# Patient Record
Sex: Male | Born: 1950 | Race: White | Hispanic: No | Marital: Married | Smoking: Never smoker
Health system: Southern US, Community
[De-identification: ages and names within clinical notes are randomized; demographics above are authoritative.]

## PROBLEM LIST (undated history)

## (undated) DIAGNOSIS — F32A Depression, unspecified: Secondary | ICD-10-CM

## (undated) DIAGNOSIS — N400 Enlarged prostate without lower urinary tract symptoms: Secondary | ICD-10-CM

## (undated) DIAGNOSIS — K219 Gastro-esophageal reflux disease without esophagitis: Secondary | ICD-10-CM

## (undated) DIAGNOSIS — K589 Irritable bowel syndrome without diarrhea: Secondary | ICD-10-CM

## (undated) DIAGNOSIS — G43909 Migraine, unspecified, not intractable, without status migrainosus: Secondary | ICD-10-CM

## (undated) DIAGNOSIS — F329 Major depressive disorder, single episode, unspecified: Secondary | ICD-10-CM

## (undated) HISTORY — PX: SEPTOPLASTY: SUR1290

## (undated) HISTORY — PX: TOE SURGERY: SHX1073

## (undated) HISTORY — PX: SHOULDER SURGERY: SHX246

## (undated) HISTORY — PX: APPENDECTOMY: SHX54

---

## 2000-06-02 ENCOUNTER — Ambulatory Visit (HOSPITAL_COMMUNITY): Admission: RE | Admit: 2000-06-02 | Discharge: 2000-06-02 | Payer: Self-pay | Admitting: Gastroenterology

## 2006-05-23 ENCOUNTER — Ambulatory Visit (HOSPITAL_BASED_OUTPATIENT_CLINIC_OR_DEPARTMENT_OTHER): Admission: RE | Admit: 2006-05-23 | Discharge: 2006-05-23 | Payer: Self-pay | Admitting: Orthopaedic Surgery

## 2017-08-19 ENCOUNTER — Encounter: Payer: Self-pay | Admitting: Emergency Medicine

## 2017-08-19 ENCOUNTER — Other Ambulatory Visit: Payer: Self-pay

## 2017-08-19 ENCOUNTER — Emergency Department (INDEPENDENT_AMBULATORY_CARE_PROVIDER_SITE_OTHER)
Admission: EM | Admit: 2017-08-19 | Discharge: 2017-08-19 | Disposition: A | Discharge status: Home / Self Care | Payer: BLUE CROSS/BLUE SHIELD | Source: Home / Self Care | Attending: Family Medicine | Admitting: Family Medicine

## 2017-08-19 ENCOUNTER — Emergency Department (INDEPENDENT_AMBULATORY_CARE_PROVIDER_SITE_OTHER): Payer: BLUE CROSS/BLUE SHIELD

## 2017-08-19 DIAGNOSIS — S61217A Laceration without foreign body of left little finger without damage to nail, initial encounter: Secondary | ICD-10-CM | POA: Diagnosis not present

## 2017-08-19 DIAGNOSIS — W228XXA Striking against or struck by other objects, initial encounter: Secondary | ICD-10-CM

## 2017-08-19 DIAGNOSIS — S6992XA Unspecified injury of left wrist, hand and finger(s), initial encounter: Secondary | ICD-10-CM | POA: Diagnosis not present

## 2017-08-19 DIAGNOSIS — Z23 Encounter for immunization: Secondary | ICD-10-CM | POA: Diagnosis not present

## 2017-08-19 HISTORY — DX: Migraine, unspecified, not intractable, without status migrainosus: G43.909

## 2017-08-19 HISTORY — DX: Benign prostatic hyperplasia without lower urinary tract symptoms: N40.0

## 2017-08-19 HISTORY — DX: Depression, unspecified: F32.A

## 2017-08-19 HISTORY — DX: Gastro-esophageal reflux disease without esophagitis: K21.9

## 2017-08-19 HISTORY — DX: Major depressive disorder, single episode, unspecified: F32.9

## 2017-08-19 MED ORDER — TETANUS-DIPHTH-ACELL PERTUSSIS 5-2.5-18.5 LF-MCG/0.5 IM SUSP
0.5000 mL | Freq: Once | INTRAMUSCULAR | Status: AC
  Administered 2017-08-19: 0.5 mL via INTRAMUSCULAR

## 2017-08-19 NOTE — ED Provider Notes (Signed)
[facility]    CSN: [id number] Arrival date & time: 08/19/17  1608     History   Chief Complaint Chief Complaint  Patient presents with  . Hand Injury    HPI Dalton Silva is a 67 y.o. male.   Patient hit his left fifth fingertip with a hammer today, resulting in a laceration.  Fortunately he was wearing gloves at the time.  He does not remember his last Tdap    Laceration  Length:  1cm Depth:  Through underlying tissue Quality comment:  Flap Bleeding: controlled   Time since incident:  4 hours Laceration mechanism:  Blunt object Foreign body present:  No foreign bodies Relieved by:  Nothing Worsened by:  Movement Ineffective treatments:  None tried Tetanus status:  Out of date Associated symptoms: no numbness and no swelling     Past Medical History:  Diagnosis Date  . BPH (benign prostatic hyperplasia)   . Depression   . GERD (gastroesophageal reflux disease)   . Migraine     There are no active problems to display for this patient.   Past Surgical History:  Procedure Laterality Date  . APPENDECTOMY    . SEPTOPLASTY    . SHOULDER SURGERY Bilateral   . TOE SURGERY         Home Medications    Prior to Admission medications   Medication Sig Start Date End Date Taking? Authorizing Provider  dicyclomine (BENTYL) 10 MG capsule Take 10 mg by mouth 4 (four) times daily -  before meals and at bedtime.   Yes [provider]  pantoprazole (PROTONIX) 40 MG tablet Take 40 mg by mouth daily.   Yes [provider]  rosuvastatin (CRESTOR) 10 MG tablet Take 10 mg by mouth daily.   Yes [provider]  sertraline (ZOLOFT) 100 MG tablet Take 100 mg by mouth daily.   Yes [provider]  SUMAtriptan (IMITREX) 100 MG tablet Take 100 mg by mouth every 2 (two) hours as needed for migraine. May repeat in 2 hours if headache persists or recurs.   Yes [provider]  tamsulosin (FLOMAX) 0.4 MG CAPS capsule Take  0.4 mg by mouth.   Yes [provider]    Family History History reviewed. No pertinent family history.  Social History Social History   Tobacco Use  . Smoking status: Never Smoker  . Smokeless tobacco: Never Used  Substance Use Topics  . Alcohol use: No    Frequency: Never  . Drug use: Not on file     Allergies   Aleve [naproxen sodium]; Fenofibrate; and Lipitor [atorvastatin calcium]   Review of Systems Review of Systems  All other systems reviewed and are negative.    Physical Exam Triage Vital Signs ED Triage Vitals [08/19/17 1745]  Enc Vitals Group     BP 108/71     Pulse Rate 61     Resp 16     Temp 97.6 F (36.4 C)     Temp Source Oral     SpO2 96 %     Weight 183 lb (83 kg)     Height 5\' 11"  (1.803 m)     Head Circumference      Peak Flow      Pain Score      Pain Loc      Pain Edu?      Excl. in GC?    No data found.  Updated Vital Signs BP 108/71 (BP Location: Right Arm)  Pulse 61   Temp 97.6 F (36.4 C) (Oral)   Resp 16   Ht 5\' 11"  (1.803 m)   Wt 183 lb (83 kg)   SpO2 96%   BMI 25.52 kg/m   Visual Acuity Right Eye Distance:   Left Eye Distance:   Bilateral Distance:    Right Eye Near:   Left Eye Near:    Bilateral Near:     Physical Exam  Constitutional: He appears well-developed and well-nourished. No distress.  HENT:  Head: Atraumatic.  Eyes: Pupils are equal, round, and reactive to light.  Cardiovascular: Normal rate.  Pulmonary/Chest: Effort normal.  Musculoskeletal:       Left hand: He exhibits tenderness and laceration. He exhibits normal range of motion, no bony tenderness, normal two-point discrimination, normal capillary refill, no deformity and no swelling.       Hands: Left fifth finger has 1cm flap laceration dorsal/lateral aspect as noted on diagram.  Finger has full range of motion all joints.  Distal neurovascular function is intact.   Neurological: He is alert.  Skin: Skin is warm and dry.    Nursing note and vitals reviewed.    UC Treatments / Results  Labs (all labs ordered are listed, but only abnormal results are displayed) Labs Reviewed - No data to display  EKG  EKG Interpretation None       Radiology Dg Finger Little Left  Result Date: 08/19/2017 CLINICAL DATA:  Left pinky pain after hitting the finger with a hammer. EXAM: LEFT LITTLE FINGER 2+V COMPARISON:  None. FINDINGS: Soft tissue swelling and subtle cutaneous deformity along the ulnar aspect of the left pinky at the level of the DIP joint. No fracture nor malalignment is seen. IMPRESSION: Soft tissue injury/swelling of the left pinky at the level of the DIP joint without underlying fracture or malalignment. Electronically Signed   By: Tollie Ethavid  Kwon M.D.   On: 08/19/2017 17:57    Procedures Procedures Laceration Repair Discussed benefits and risks of procedure and verbal consent obtained. Using sterile technique and digital anesthesia with 2% lidocaine without epinephrine, cleansed wound with Betadine followed by copious lavage with normal saline.  Wound carefully inspected for debris and foreign bodies; none found.  Wound closed with #4, 5-0 interrupted nylon sutures.  Bacitracin and non-stick sterile dressing applied.  Wound precautions explained to patient.  Return for suture removal in 10 days.   Medications Ordered in UC Medications  Tdap (BOOSTRIX) injection 0.5 mL (0.5 mLs Intramuscular Given 08/19/17 1801)     Initial Impression / Assessment and Plan / UC Course  I have reviewed the triage vital signs and the nursing notes.  Pertinent labs & imaging results that were available during my care of the patient were reviewed by me and considered in my medical decision making (see chart for details).    Administered Tdap  Change dressing daily and apply Bacitracin ointment to wound.  Keep wound clean and dry.  Return for any signs of infection (or follow-up with family doctor):  Increasing redness,  swelling, pain, heat, drainage, etc. Return in 10 days for suture removal.      Final Clinical Impressions(s) / UC Diagnoses   Final diagnoses:  Laceration of left little finger without foreign body without damage to nail, initial encounter    ED Discharge Orders    None          Lattie HawBeese, Stephen A, MD 08/25/17 [id number]

## 2017-08-19 NOTE — ED Triage Notes (Addendum)
Hit left little finger while wearing work gloves with hammer and has laceration; bleeding stabilized. Knows he is due for Tdap.

## 2017-08-19 NOTE — Discharge Instructions (Signed)
Change dressing daily and apply Bacitracin ointment to wound.  Keep wound clean and dry.  Return for any signs of infection (or follow-up with family doctor):  Increasing redness, swelling, pain, heat, drainage, etc. °Return in 10 days for suture removal.   °

## 2019-02-05 IMAGING — DX DG FINGER LITTLE 2+V*L*
3 series · 3 of 3 positions shown · non-contrast
Comparison: None.

CLINICAL DATA: Left pinky pain after hitting the finger with a
hammer.

EXAM:
LEFT LITTLE FINGER 2+V

[finger ap]
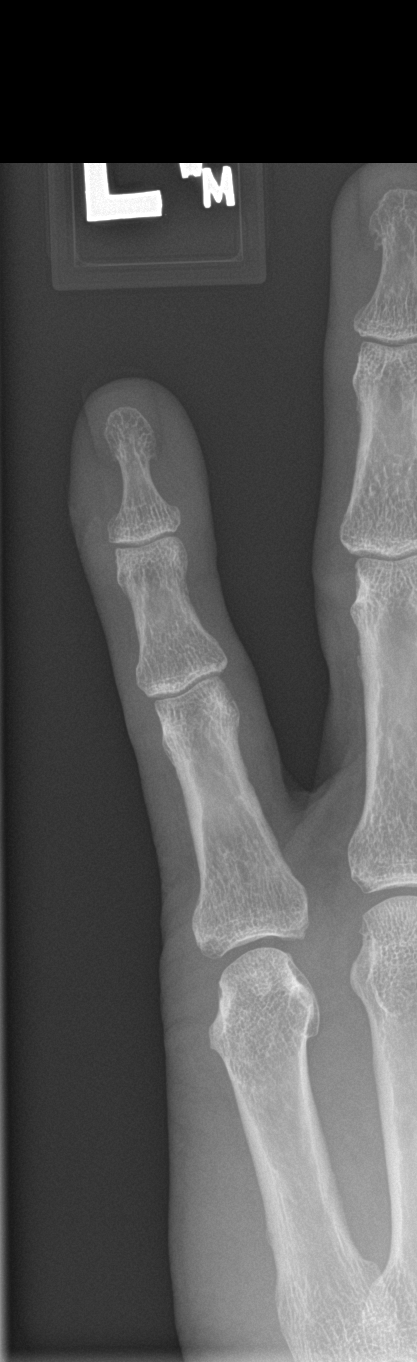

[finger obl]
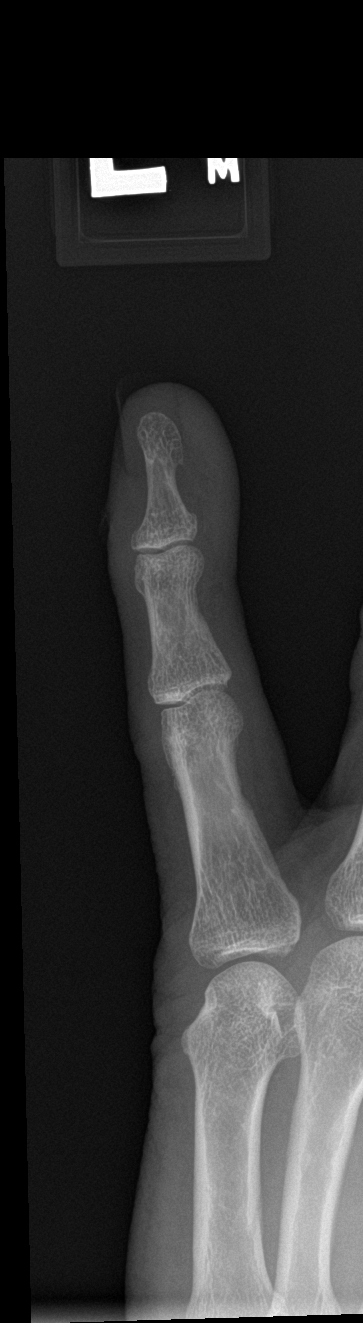

[finger lat]
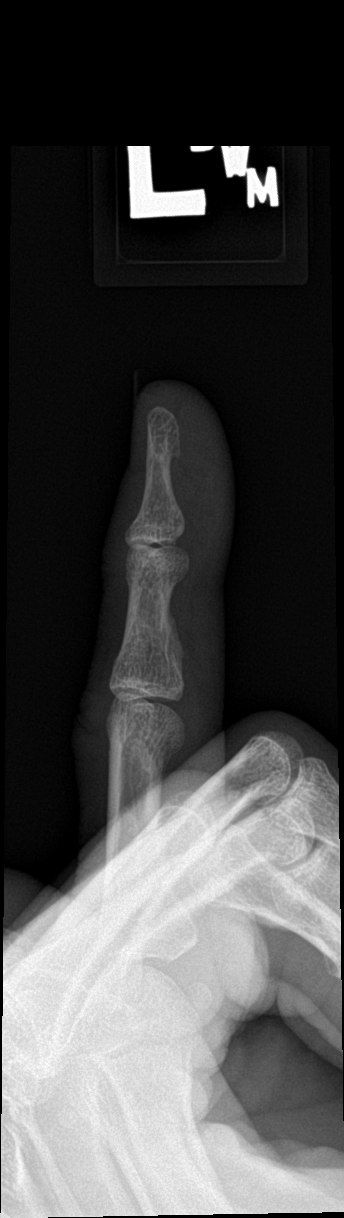

[3 of 3 positions shown; findings below may reference images not displayed]

FINDINGS: Soft tissue swelling and subtle cutaneous deformity along the ulnar
aspect of the left pinky at the level of the DIP joint. No fracture
nor malalignment is seen.
IMPRESSION: Soft tissue injury/swelling of the left pinky at the level of the
DIP joint without underlying fracture or malalignment.

## 2020-06-21 ENCOUNTER — Other Ambulatory Visit: Payer: Self-pay | Admitting: Oncology

## 2020-06-21 DIAGNOSIS — U071 COVID-19: Secondary | ICD-10-CM

## 2020-06-21 NOTE — Progress Notes (Signed)
I connected by phone with Dalton Silva  to discuss the potential use of an new treatment for mild to moderate COVID-19 viral infection in non-hospitalized patients.   This patient is a age/sex that meets the FDA criteria for Emergency Use Authorization of casirivimab\imdevimab.  Has a (+) direct SARS-CoV-2 viral test result 1. Has mild or moderate COVID-19  2. Is ? 67 years of age and weighs ? 40 kg 3. Is NOT hospitalized due to COVID-19 4. Is NOT requiring oxygen therapy or requiring an increase in baseline oxygen flow rate due to COVID-19 5. Is within 10 days of symptom onset 6. Has at least one of the high risk factor(s) for progression to severe COVID-19 and/or hospitalization as defined in EUA. ? Specific high risk criteria :   Symptom onset  06/16/20   I have spoken and communicated the following to the patient or parent/caregiver:   1. FDA has authorized the emergency use of casirivimab\imdevimab for the treatment of mild to moderate COVID-19 in adults and pediatric patients with positive results of direct SARS-CoV-2 viral testing who are 21 years of age and older weighing at least 40 kg, and who are at high risk for progressing to severe COVID-19 and/or hospitalization.   2. The significant known and potential risks and benefits of casirivimab\imdevimab, and the extent to which such potential risks and benefits are unknown.   3. Information on available alternative treatments and the risks and benefits of those alternatives, including clinical trials.   4. Patients treated with casirivimab\imdevimab should continue to self-isolate and use infection control measures (e.g., wear mask, isolate, social distance, avoid sharing personal items, clean and disinfect "high touch" surfaces, and frequent handwashing) according to CDC guidelines.    5. The patient or parent/caregiver has the option to accept or refuse casirivimab\imdevimab .   After reviewing this information with the patient,  The patient agreed to proceed with receiving casirivimab\imdevimab infusion and will be provided a copy of the Fact sheet prior to receiving the infusion.Mignon Pine, AGNP-C [phone] (Infusion Center Hotline)

## 2020-06-23 ENCOUNTER — Ambulatory Visit (HOSPITAL_COMMUNITY)
Admission: RE | Admit: 2020-06-23 | Discharge: 2020-06-23 | Disposition: A | Discharge status: Home / Self Care | Payer: BC Managed Care – PPO | Source: Ambulatory Visit | Attending: Pulmonary Disease | Admitting: Pulmonary Disease

## 2020-06-23 ENCOUNTER — Other Ambulatory Visit (HOSPITAL_COMMUNITY): Payer: Self-pay

## 2020-06-23 DIAGNOSIS — U071 COVID-19: Secondary | ICD-10-CM | POA: Diagnosis present

## 2020-06-23 DIAGNOSIS — Z23 Encounter for immunization: Secondary | ICD-10-CM | POA: Diagnosis not present

## 2020-06-23 MED ORDER — ALBUTEROL SULFATE HFA 108 (90 BASE) MCG/ACT IN AERS: 2.0000 | INHALATION_SPRAY | Freq: Once | RESPIRATORY_TRACT | Status: DC | PRN

## 2020-06-23 MED ORDER — FAMOTIDINE IN NACL 20-0.9 MG/50ML-% IV SOLN: 20.0000 mg | Freq: Once | INTRAVENOUS | Status: DC | PRN

## 2020-06-23 MED ORDER — SODIUM CHLORIDE 0.9 % IV SOLN: INTRAVENOUS | Status: DC | PRN

## 2020-06-23 MED ORDER — EPINEPHRINE 0.3 MG/0.3ML IJ SOAJ: 0.3000 mg | Freq: Once | INTRAMUSCULAR | Status: DC | PRN

## 2020-06-23 MED ORDER — SODIUM CHLORIDE 0.9 % IV SOLN: Freq: Once | INTRAVENOUS | Status: AC

## 2020-06-23 MED ORDER — METHYLPREDNISOLONE SODIUM SUCC 125 MG IJ SOLR: 125.0000 mg | Freq: Once | INTRAMUSCULAR | Status: DC | PRN

## 2020-06-23 MED ORDER — DIPHENHYDRAMINE HCL 50 MG/ML IJ SOLN: 50.0000 mg | Freq: Once | INTRAMUSCULAR | Status: DC | PRN

## 2020-06-23 NOTE — Progress Notes (Signed)
  Diagnosis: COVID-19  Physician: Dr. Wright  Procedure: [facility] Med: bamlanivimab\etesevimab infusion - Provided patient with bamlanimivab\etesevimab fact sheet for patients, parents and caregivers prior to infusion.  Complications: No immediate complications noted.  Discharge: Discharged home   Stephonie Wilcoxen [name] 06/23/2020    

## 2020-06-23 NOTE — Discharge Instructions (Signed)
If you have any questions or concerns after the infusion please call the Advanced Practice Provider on call at [phone]. This number is ONLY intended for your use regarding questions or concerns about the infusion post-treatment side-effects.  Please do not provide this number to others for use. For return to work notes please contact your primary care provider.  ° °If someone you know is interested in receiving treatment please have them call the COVID hotline at [phone]. ° ° ° ° ° ° °What types of side effects do monoclonal antibody drugs cause?  °Common side effects ° °In general, the more common side effects caused by monoclonal antibody drugs include: °• Allergic reactions, such as hives or itching °• Flu-like signs and symptoms, including chills, fatigue, fever, and muscle aches and pains °• Nausea, vomiting °• Diarrhea °• Skin rashes °• Low blood pressure ° ° °The CDC is recommending patients who receive monoclonal antibody treatments wait at least 90 days before being vaccinated. ° °Currently, there are no data on the safety and efficacy of mRNA COVID-19 vaccines in persons who received monoclonal antibodies or convalescent plasma as part of COVID-19 treatment. Based on the estimated half-life of such therapies as well as evidence suggesting that reinfection is uncommon in the 90 days after initial infection, vaccination should be deferred for at least 90 days, as a precautionary measure until additional information becomes available, to avoid interference of the antibody treatment with vaccine-induced immune responses. ° ° ° ° ° ° °10 Things You Can Do to Manage Your COVID-19 Symptoms at Home °If you have possible or confirmed COVID-19: °1. Stay home from work and school. And stay away from other public places. If you must go out, avoid using any kind of public transportation, ridesharing, or taxis. °2. Monitor your symptoms carefully. If your symptoms get worse, call your healthcare provider  immediately. °3. Get rest and stay hydrated. °4. If you have a medical appointment, call the healthcare provider ahead of time and tell them that you have or may have COVID-19. °5. For medical emergencies, call 911 and notify the dispatch personnel that you have or may have COVID-19. °6. Cover your cough and sneezes with a tissue or use the inside of your elbow. °7. Wash your hands often with soap and water for at least 20 seconds or clean your hands with an alcohol-based hand sanitizer that contains at least 60% alcohol. °8. As much as possible, stay in a specific room and away from other people in your home. Also, you should use a separate bathroom, if available. If you need to be around other people in or outside of the home, wear a mask. °9. Avoid sharing personal items with other people in your household, like dishes, towels, and bedding. °10. Clean all surfaces that are touched often, like counters, tabletops, and doorknobs. Use household cleaning sprays or wipes according to the label instructions. °cdc.gov/coronavirus °01/02/2019 °This information is not intended to replace advice given to you by your health care provider. Make sure you discuss any questions you have with your health care provider. °Document Revised: 06/06/2019 Document Reviewed: 06/06/2019 °Elsevier Patient Education © 2020 [employer] ° °

## 2021-06-17 ENCOUNTER — Other Ambulatory Visit: Payer: Self-pay

## 2021-06-17 ENCOUNTER — Emergency Department
Admission: EM | Admit: 2021-06-17 | Discharge: 2021-06-17 | Disposition: A | Discharge status: Home / Self Care | Payer: Medicare Other | Source: Home / Self Care

## 2021-06-17 DIAGNOSIS — J069 Acute upper respiratory infection, unspecified: Secondary | ICD-10-CM

## 2021-06-17 MED ORDER — AZITHROMYCIN 250 MG PO TABS: ORAL_TABLET | ORAL | 0 refills | Status: DC

## 2021-06-17 NOTE — Discharge Instructions (Signed)
Drink lots of fluids Use over-the-counter cough and cold medicine as needed I have given you prescription for azithromycin.  If you feel worse instead of better, or fail to see improvement over the next couple of days consider taking the antibiotic

## 2021-06-17 NOTE — ED Provider Notes (Signed)
[facility]    CSN: [id number] Arrival date & time: 06/17/21  1627      History   Chief Complaint Chief Complaint  Patient presents with   Cough    HPI Dalton Silva is a 70 y.o. male.   HPI Patient has an upper respiratory infection.  Is been coughing for about 3 days.  He is using over-the-counter cough medicines.  When he coughs he has a slight burning in his chest.  No shortness of breath.  No exposure to flu or COVID.  He does not have any headache body aches fever or chills Past Medical History:  Diagnosis Date   BPH (benign prostatic hyperplasia)    Depression    GERD (gastroesophageal reflux disease)    Migraine     There are no problems to display for this patient.   Past Surgical History:  Procedure Laterality Date   APPENDECTOMY     SEPTOPLASTY     SHOULDER SURGERY Bilateral    TOE SURGERY         Home Medications    Prior to Admission medications   Medication Sig Start Date End Date Taking? Authorizing Provider  azithromycin (ZITHROMAX Z-PAK) 250 MG tablet Take 2 pills today.  Starting tomorrow take 1 pill a day until gone 06/17/21  Yes Eustace Moore, MD  pantoprazole (PROTONIX) 40 MG tablet Take by mouth. 03/11/21  Yes [provider]  rosuvastatin (CRESTOR) 10 MG tablet Take 1 tablet by mouth daily. 01/08/21  Yes [provider]  tamsulosin (FLOMAX) 0.4 MG CAPS capsule Take 1 tablet by mouth daily. 06/04/21  Yes [provider]  Cholecalciferol 50 MCG (2000 UT) TABS daily.    [provider]  dicyclomine (BENTYL) 10 MG capsule Take 10 mg by mouth 4 (four) times daily -  before meals and at bedtime.    [provider]  propranolol ER (INDERAL LA) 80 MG 24 hr capsule Take 80 mg by mouth daily. 06/10/21   [provider]  sertraline (ZOLOFT) 100 MG tablet Take 100 mg by mouth daily.    [provider]  SUMAtriptan (IMITREX) 100 MG tablet Take 100 mg by mouth every 2  (two) hours as needed for migraine. May repeat in 2 hours if headache persists or recurs.    [provider]    Family History History reviewed. No pertinent family history.  Social History Social History   Tobacco Use   Smoking status: Never   Smokeless tobacco: Never  Substance Use Topics   Alcohol use: No     Allergies   Aleve [naproxen sodium], Fenofibrate, and Lipitor [atorvastatin calcium]   Review of Systems Review of Systems  See HPI Physical Exam Triage Vital Signs ED Triage Vitals [06/17/21 1639]  Enc Vitals Group     BP (!) 151/80     Pulse Rate (!) 59     Resp 16     Temp 98.2 F (36.8 C)     Temp Source Oral     SpO2 94 %     Weight      Height      Head Circumference      Peak Flow      Pain Score 0     Pain Loc      Pain Edu?      Excl. in GC?    No data found.  Updated Vital Signs BP (!) 151/80 (BP Location: Left Arm)    Pulse Marland Kitchen)  59    Temp 98.2 F (36.8 C) (Oral)    Resp 16    SpO2 94%   Physical Exam Constitutional:      General: He is not in acute distress.    Appearance: He is well-developed and normal weight.  HENT:     Head: Normocephalic and atraumatic.     Right Ear: Tympanic membrane and ear canal normal.     Left Ear: Tympanic membrane and ear canal normal.     Nose: Nose normal. No congestion.     Mouth/Throat:     Pharynx: No posterior oropharyngeal erythema.  Eyes:     Conjunctiva/sclera: Conjunctivae normal.     Pupils: Pupils are equal, round, and reactive to light.  Cardiovascular:     Rate and Rhythm: Normal rate and regular rhythm.     Heart sounds: Normal heart sounds.  Pulmonary:     Effort: Pulmonary effort is normal. No respiratory distress.     Breath sounds: Normal breath sounds.  Abdominal:     General: There is no distension.     Palpations: Abdomen is soft.  Musculoskeletal:        General: Normal range of motion.     Cervical back: Normal range of motion.  Skin:    General: Skin is warm  and dry.  Neurological:     Mental Status: He is alert.     UC Treatments / Results  Labs (all labs ordered are listed, but only abnormal results are displayed) Labs Reviewed - No data to display  EKG   Radiology No results found.  Procedures Procedures (including critical care time)  Medications Ordered in UC Medications - No data to display  Initial Impression / Assessment and Plan / UC Course  I have reviewed the triage vital signs and the nursing notes.  Pertinent labs & imaging results that were available during my care of the patient were reviewed by me and considered in my medical decision making (see chart for details).     Final Clinical Impressions(s) / UC Diagnoses   Final diagnoses:  Viral URI with cough     Discharge Instructions      Drink lots of fluids Use over-the-counter cough and cold medicine as needed I have given you prescription for azithromycin.  If you feel worse instead of better, or fail to see improvement over the next couple of days consider taking the antibiotic   ED Prescriptions     Medication Sig Dispense Auth. Provider   azithromycin (ZITHROMAX Z-PAK) 250 MG tablet Take 2 pills today.  Starting tomorrow take 1 pill a day until gone 6 tablet Delton See Letta Pate, MD      PDMP not reviewed this encounter.   Eustace Moore, MD 06/17/21 [phone]

## 2021-06-17 NOTE — ED Triage Notes (Signed)
Pt present dry cough for three days with burning sensation in the chest area. Pt is been using cough drops for relief

## 2021-08-24 ENCOUNTER — Encounter: Payer: Self-pay | Admitting: Emergency Medicine

## 2021-08-24 ENCOUNTER — Other Ambulatory Visit: Payer: Self-pay

## 2021-08-24 ENCOUNTER — Emergency Department (INDEPENDENT_AMBULATORY_CARE_PROVIDER_SITE_OTHER)
Admission: EM | Admit: 2021-08-24 | Discharge: 2021-08-24 | Disposition: A | Discharge status: Home / Self Care | Payer: Medicare Other | Source: Home / Self Care

## 2021-08-24 DIAGNOSIS — H5789 Other specified disorders of eye and adnexa: Secondary | ICD-10-CM | POA: Diagnosis not present

## 2021-08-24 DIAGNOSIS — J029 Acute pharyngitis, unspecified: Secondary | ICD-10-CM | POA: Diagnosis not present

## 2021-08-24 LAB — POCT RAPID STREP A (OFFICE): Rapid Strep A Screen: NEGATIVE

## 2021-08-24 MED ORDER — ERYTHROMYCIN 5 MG/GM OP OINT
TOPICAL_OINTMENT | OPHTHALMIC | 0 refills | Status: AC
Start: 2021-08-24 — End: ?

## 2021-08-24 MED ORDER — CETIRIZINE HCL 10 MG PO TABS
10.0000 mg | ORAL_TABLET | Freq: Every day | ORAL | 0 refills | Status: AC
Start: 2021-08-24 — End: ?

## 2021-08-24 NOTE — ED Provider Notes (Signed)
[facility]    CSN: [id number] Arrival date & time: 08/24/21  1546      History   Chief Complaint Chief Complaint  Patient presents with   Sore Throat    HPI Dalton Silva is a 71 y.o. male.   Patient presents today with a several day history of sore throat.  He reports that symptoms have since improved but he continues to have occasional sore throat symptoms.  He denies any significant nasal congestion, cough, fever, body aches.  Does report household sick contacts with traditional URI symptoms.  He has tried Tylenol and ibuprofen without improvement of symptoms.  He is eating and drinking normally despite symptoms.  Denies any recent antibiotic use.  In addition, patient reports that he has had irritation of his left eye.  Several days ago he was eating a bag of chips when some of the crumbs got into his eye.  Since that time he has had a scratching/irritated sensation.  He denies any ocular pain.  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  He denies any visual disturbance, headache, dizziness, redness.  He has tried lubricating eyedrops without improvement of symptoms.   Past Medical History:  Diagnosis Date   BPH (benign prostatic hyperplasia)    Depression    GERD (gastroesophageal reflux disease)    Migraine     There are no problems to display for this patient.   Past Surgical History:  Procedure Laterality Date   APPENDECTOMY     SEPTOPLASTY     SHOULDER SURGERY Bilateral    TOE SURGERY         Home Medications    Prior to Admission medications   Medication Sig Start Date End Date Taking? Authorizing Provider  cetirizine (ZYRTEC ALLERGY) 10 MG tablet Take 1 tablet (10 mg total) by mouth daily. 08/24/21  Yes Sarahi Borland K, PA-C  erythromycin ophthalmic ointment Place a 1/2 inch ribbon of ointment into the lower eyelid. 08/24/21  Yes Rae Sutcliffe, Noberto Retort, PA-C  Cholecalciferol 50 MCG (2000 UT)  TABS daily.    [provider]  dicyclomine (BENTYL) 10 MG capsule Take 10 mg by mouth 4 (four) times daily -  before meals and at bedtime.    [provider]  pantoprazole (PROTONIX) 40 MG tablet Take by mouth. 03/11/21   [provider]  propranolol ER (INDERAL LA) 80 MG 24 hr capsule Take 80 mg by mouth daily. 06/10/21   [provider]  rosuvastatin (CRESTOR) 10 MG tablet Take 1 tablet by mouth daily. 01/08/21   [provider]  sertraline (ZOLOFT) 100 MG tablet Take 100 mg by mouth daily.    [provider]  SUMAtriptan (IMITREX) 100 MG tablet Take 100 mg by mouth every 2 (two) hours as needed for migraine. May repeat in 2 hours if headache persists or recurs.    [provider]  tamsulosin (FLOMAX) 0.4 MG CAPS capsule Take 1 tablet by mouth daily. 06/04/21   [provider]    Family History Family History  Problem Relation Age of Onset   Dementia Mother    Cancer Mother    Cancer Father    Dementia Father     Social History Social History   Tobacco Use   Smoking status: Never    Passive exposure: Never   Smokeless tobacco: Never  Vaping Use   Vaping Use: Never used  Substance Use Topics  Alcohol use: No   Drug use: Never     Allergies   Naproxen sodium, Atorvastatin, Fenofibrate, and Lipitor [atorvastatin calcium]   Review of Systems Review of Systems  Constitutional:  Positive for activity change. Negative for appetite change, fatigue and fever.  HENT:  Positive for sore throat. Negative for congestion, sinus pressure and sneezing.   Eyes:  Positive for discharge, redness and itching. Negative for photophobia, pain and visual disturbance.  Respiratory:  Negative for cough and shortness of breath.   Cardiovascular:  Negative for chest pain.  Gastrointestinal:  Negative for abdominal pain, diarrhea, nausea and vomiting.  Neurological:  Negative for dizziness, light-headedness and headaches.     Physical Exam Triage Vital Signs ED Triage Vitals  Enc Vitals Group     BP 08/24/21 1600 96/63     Pulse Rate 08/24/21 1600 61     Resp 08/24/21 1600 15     Temp 08/24/21 1600 98.8 F (37.1 C)     Temp src --      SpO2 08/24/21 1600 95 %     Weight 08/24/21 1601 183 lb (83 kg)     Height 08/24/21 1601 5\' 11"  (1.803 m)     Head Circumference --      Peak Flow --      Pain Score 08/24/21 1601 4     Pain Loc --      Pain Edu? --      Excl. in [place]? --    No data found.  Updated Vital Signs BP 96/63 (BP Location: Right Arm) Comment: normal BP is in the 105/70 pe rpt   Pulse 61    Temp 98.8 F (37.1 C)    Resp 15    Ht 5\' 11"  (1.803 m)    Wt 183 lb (83 kg)    SpO2 95%    BMI 25.52 kg/m   Visual Acuity Right Eye Distance: 20/25 Left Eye Distance: 20/40 Bilateral Distance: 20/20  Right Eye Near:   Left Eye Near:    Bilateral Near:     Physical Exam Vitals reviewed.  Constitutional:      General: He is awake.     Appearance: Normal appearance. He is well-developed. He is not ill-appearing.     Comments: Very pleasant male appears stated age in no acute distress sitting comfortably in exam room  HENT:     Head: Normocephalic and atraumatic.     Right Ear: Tympanic membrane, ear canal and external ear normal. Tympanic membrane is not erythematous or bulging.     Left Ear: Tympanic membrane, ear canal and external ear normal. Tympanic membrane is not erythematous or bulging.     Nose: Nose normal.     Mouth/Throat:     Pharynx: Uvula midline. Posterior oropharyngeal erythema present. No oropharyngeal exudate or uvula swelling.  Eyes:     General: Lids are everted, no foreign bodies appreciated.        Right eye: No discharge or hordeolum.        Left eye: No discharge or hordeolum.     Extraocular Movements: Extraocular movements intact.     Conjunctiva/sclera:     Right eye: Right conjunctiva is injected.     Left eye: Left conjunctiva is not injected.     Pupils:  Pupils are equal, round, and reactive to light.     Right eye: No corneal abrasion or fluorescein uptake.  Cardiovascular:     Rate and Rhythm: Normal rate and regular  rhythm.     Heart sounds: Normal heart sounds, S1 normal and S2 normal. No murmur heard. Pulmonary:     Effort: Pulmonary effort is normal. No accessory muscle usage or respiratory distress.     Breath sounds: Normal breath sounds. No stridor. No wheezing, rhonchi or rales.     Comments: Clear to auscultation bilaterally Neurological:     Mental Status: He is alert.  Psychiatric:        Behavior: Behavior is cooperative.     UC Treatments / Results  Labs (all labs ordered are listed, but only abnormal results are displayed) Labs Reviewed  CULTURE, GROUP A STREP  POCT RAPID STREP A (OFFICE)    EKG   Radiology No results found.  Procedures Procedures (including critical care time)  Medications Ordered in UC Medications - No data to display  Initial Impression / Assessment and Plan / UC Course  I have reviewed the triage vital signs and the nursing notes.  Pertinent labs & imaging results that were available during my care of the patient were reviewed by me and considered in my medical decision making (see chart for details).     No obvious corneal defect on fluorescein exam.  Patient did have some improvement of foreign body sensation after using Q-tip on inner eyelids.  We will start erythromycin and he was instructed to avoid touching tip of medication bottle to his eye and to wash hands prior to handling medication in order to prevent contamination of medicine.  He can use lubricating eyedrops for additional symptom relief.  Recommended he clean his eye with a warm compress as needed.  Discussed that if symptoms not improving quickly with treatment he should follow-up with ophthalmology; is already established with [facility].  Discussed alarm symptoms that warrant emergent evaluation including visual  change, eye pain, severe redness.  Strict return precautions given to which he expressed understanding.  Strep was negative in clinic today.  No indication for additional viral testing as patient has no other URI symptoms.  Concern for allergies is contributing to symptoms.  We will start Zyrtec daily.  Recommended that he gargle with warm salt water on a regular basis to manage symptoms.  Discussed that if at any point anything worsens he develops high fever, worsening shortness of breath, swelling of his throat, difficulty speaking, muffled voice, trouble swallowing he needs to be seen immediately.  If symptoms are not improving by next week he should follow-up here see PCP.  Strict return precautions given to which patient expressed understanding.  Final Clinical Impressions(s) / UC Diagnoses   Final diagnoses:  Acute pharyngitis, unspecified etiology  Sore throat  Irritation of right eye     Discharge Instructions      Your strep test was negative.  I believe that your sore throat symptoms are primarily allergy related.  Start Zyrtec daily.  Gargle with warm salt water multiple times per day to help prevent congestion from causing a sore throat.  If at any point anything worsens and you develop high fever, difficulty speaking, difficulty swallowing, swelling of your throat you should be seen immediately.  I am hopeful that we caught any remaining foreign body out of your eye with the exam today.  Please use the erythromycin ointment at night.  Do not touch the tip of bottle to your eye and make sure to wash her hands prior to handling medication to prevent contamination.  Use lubricating eyedrops/artificial tears for additional symptom relief.  Clean  your eye with a warm compress.  If symptoms or not improving quickly within a day or 2 please follow-up with your ophthalmologist.  If at any point your vision changes or you develop severe eye pain you should go to the emergency room.     ED  Prescriptions     Medication Sig Dispense Auth. Provider   erythromycin ophthalmic ointment Place a 1/2 inch ribbon of ointment into the lower eyelid. 3.5 g Racquelle Hyser K, PA-C   cetirizine (ZYRTEC ALLERGY) 10 MG tablet Take 1 tablet (10 mg total) by mouth daily. 14 tablet Lathon Adan, Derry Skill, PA-C      PDMP not reviewed this encounter.   Terrilee Croak, PA-C 08/24/21 1716

## 2021-08-24 NOTE — ED Triage Notes (Signed)
Sore throat since Friday  Hurts to swallow  OTC meds - Advil & Tylenol (last dose 2 pm) Also here for  R eye redness & irritation since Thursday - pt thinks he got some potato chip crumbs in his eye Pt's wife flushed his eye w/ an  OTC eye wash

## 2021-08-24 NOTE — Discharge Instructions (Signed)
Your strep test was negative.  I believe that your sore throat symptoms are primarily allergy related.  Start Zyrtec daily.  Gargle with warm salt water multiple times per day to help prevent congestion from causing a sore throat.  If at any point anything worsens and you develop high fever, difficulty speaking, difficulty swallowing, swelling of your throat you should be seen immediately.  I am hopeful that we caught any remaining foreign body out of your eye with the exam today.  Please use the erythromycin ointment at night.  Do not touch the tip of bottle to your eye and make sure to wash her hands prior to handling medication to prevent contamination.  Use lubricating eyedrops/artificial tears for additional symptom relief.  Clean your eye with a warm compress.  If symptoms or not improving quickly within a day or 2 please follow-up with your ophthalmologist.  If at any point your vision changes or you develop severe eye pain you should go to the emergency room.

## 2021-08-27 LAB — CULTURE, GROUP A STREP: Strep A Culture: NEGATIVE

## 2024-02-20 ENCOUNTER — Ambulatory Visit: Admission: EM | Admit: 2024-02-20 | Discharge: 2024-02-20 | Disposition: A | Discharge status: Home / Self Care

## 2024-02-20 ENCOUNTER — Encounter: Payer: Self-pay | Admitting: *Deleted

## 2024-02-20 ENCOUNTER — Other Ambulatory Visit: Payer: Self-pay

## 2024-02-20 DIAGNOSIS — R059 Cough, unspecified: Secondary | ICD-10-CM | POA: Diagnosis not present

## 2024-02-20 DIAGNOSIS — J069 Acute upper respiratory infection, unspecified: Secondary | ICD-10-CM | POA: Diagnosis not present

## 2024-02-20 HISTORY — DX: Irritable bowel syndrome, unspecified: K58.9

## 2024-02-20 MED ORDER — PREDNISONE 20 MG PO TABS: ORAL_TABLET | ORAL | 0 refills | Status: AC

## 2024-02-20 MED ORDER — DOXYCYCLINE HYCLATE 100 MG PO CAPS: 100.0000 mg | ORAL_CAPSULE | Freq: Two times a day (BID) | ORAL | 0 refills | Status: AC

## 2024-02-20 MED ORDER — HYDROCODONE BIT-HOMATROP MBR 5-1.5 MG/5ML PO SOLN: 5.0000 mL | Freq: Four times a day (QID) | ORAL | 0 refills | Status: AC | PRN

## 2024-02-20 NOTE — Discharge Instructions (Addendum)
 Advised patient take medication as directed with food to completion.  Advised patient to take prednisone  with first dose of doxycycline  for the next 5 of 7 days.  Advised may use Hycodan cough syrup at night prior to sleep for cough due to sedative effects.  Encouraged increase daily water intake to 64 ounces per day while taking these medications.  Advised if symptoms worsen and/or unresolved please follow-up with your PCP or here for further evaluation.

## 2024-02-20 NOTE — ED Triage Notes (Addendum)
 C/O cough and nasal congestion onset couple days ago. Unsure if fevers. C/O difficulty sleeping at night due to rattling and wheezing in chest. C/O chest burning with cough. Has been taking cough syrup with decongestant, and Mucinex.

## 2024-02-20 NOTE — ED Provider Notes (Signed)
 [facility]    CSN: [id number] Arrival date & time: 02/20/24  1725      History   Chief Complaint Chief Complaint  Patient presents with   Cough    HPI Dalton Silva is a 73 y.o. male.   HPI 73 year old male presents with cough and nasal congestion for a couple of days.  Patient reports taking cough syrup decongestion and Mucinex.  PMH significant for depression and GERD and migraine.  Past Medical History:  Diagnosis Date   BPH (benign prostatic hyperplasia)    Depression    GERD (gastroesophageal reflux disease)    IBS (irritable bowel syndrome)    Migraine     There are no active problems to display for this patient.   Past Surgical History:  Procedure Laterality Date   APPENDECTOMY     SEPTOPLASTY     SHOULDER SURGERY Bilateral    TOE SURGERY         Home Medications    Prior to Admission medications   Medication Sig Start Date End Date Taking? Authorizing Provider  baclofen (LIORESAL) 10 MG tablet Take 10 mg by mouth at bedtime.   Yes [provider]  Cholecalciferol 50 MCG (2000 UT) TABS daily.   Yes [provider]  dicyclomine (BENTYL) 10 MG capsule Take 10 mg by mouth 4 (four) times daily -  before meals and at bedtime.   Yes [provider]  doxycycline  (VIBRAMYCIN ) 100 MG capsule Take 1 capsule (100 mg total) by mouth 2 (two) times daily for 7 days. 02/20/24 02/27/24 Yes Teddy Sharper, FNP  Erenumab-aooe (AIMOVIG [place]) Inject into the skin every 30 (thirty) days.   Yes [provider]  HYDROcodone  bit-homatropine (HYCODAN) 5-1.5 MG/5ML syrup Take 5 mLs by mouth every 6 (six) hours as needed for cough. 02/20/24  Yes Teddy Sharper, FNP  pantoprazole (PROTONIX) 40 MG tablet Take by mouth. 03/11/21  Yes [provider]  predniSONE  (DELTASONE ) 20 MG tablet Take 3 tabs PO daily x 5 days. 02/20/24  Yes Teddy Sharper, FNP  propranolol ER (INDERAL LA) 80 MG 24 hr capsule Take 80 mg by mouth daily.  06/10/21  Yes [provider]  rosuvastatin (CRESTOR) 10 MG tablet Take 1 tablet by mouth daily. 01/08/21  Yes [provider]  sertraline (ZOLOFT) 100 MG tablet Take 100 mg by mouth daily.   Yes [provider]  tamsulosin (FLOMAX) 0.4 MG CAPS capsule Take 1 tablet by mouth daily. 06/04/21  Yes [provider]  cetirizine  (ZYRTEC  ALLERGY) 10 MG tablet Take 1 tablet (10 mg total) by mouth daily. 08/24/21   Raspet, Erin K, PA-C  erythromycin  ophthalmic ointment Place a 1/2 inch ribbon of ointment into the lower eyelid. 08/24/21   Raspet, Erin K, PA-C  SUMAtriptan (IMITREX) 100 MG tablet Take 100 mg by mouth every 2 (two) hours as needed for migraine. May repeat in 2 hours if headache persists or recurs.    [provider]    Family History Family History  Problem Relation Age of Onset   Dementia Mother    Cancer Mother    Cancer Father    Dementia Father     Social History Social History   Tobacco Use   Smoking status: Never    Passive exposure: Never   Smokeless tobacco: Never  Vaping Use   Vaping status: Never Used  Substance Use Topics   Alcohol use: No   Drug use: Never     Allergies  Naproxen sodium, Atorvastatin, Fenofibrate, and Lipitor [atorvastatin calcium]   Review of Systems Review of Systems   Physical Exam Triage Vital Signs ED Triage Vitals  Encounter Vitals Group     BP 02/20/24 1741 115/77     Girls Systolic BP Percentile --      Girls Diastolic BP Percentile --      Boys Systolic BP Percentile --      Boys Diastolic BP Percentile --      Pulse Rate 02/20/24 1739 64     Resp 02/20/24 1739 (!) 22     Temp 02/20/24 1739 99.2 F (37.3 C)     Temp Source 02/20/24 1739 Oral     SpO2 02/20/24 1739 95 %     Weight --      Height --      Head Circumference --      Peak Flow --      Pain Score 02/20/24 1740 10     Pain Loc --      Pain Education --      Exclude from Growth Chart --    No data  found.  Updated Vital Signs BP 115/77   Pulse 64   Temp 99.2 F (37.3 C) (Oral)   Resp (!) 22   SpO2 95%    Physical Exam Vitals and nursing note reviewed.  Constitutional:      Appearance: Normal appearance. He is normal weight. He is ill-appearing.  HENT:     Head: Normocephalic and atraumatic.     Right Ear: Tympanic membrane, ear canal and external ear normal.     Left Ear: Tympanic membrane, ear canal and external ear normal.     Mouth/Throat:     Mouth: Mucous membranes are moist.     Pharynx: Oropharynx is clear.  Eyes:     Extraocular Movements: Extraocular movements intact.     Pupils: Pupils are equal, round, and reactive to light.  Cardiovascular:     Rate and Rhythm: Normal rate and regular rhythm.     Pulses: Normal pulses.     Heart sounds: Normal heart sounds.  Pulmonary:     Effort: Pulmonary effort is normal.     Breath sounds: Normal breath sounds. No wheezing, rhonchi or rales.     Comments: Frequent nonproductive cough on exam Musculoskeletal:        General: Normal range of motion.     Cervical back: Normal range of motion and neck supple.  Skin:    General: Skin is warm and dry.  Neurological:     General: No focal deficit present.     Mental Status: He is alert and oriented to person, place, and time. Mental status is at baseline.      UC Treatments / Results  Labs (all labs ordered are listed, but only abnormal results are displayed) Labs Reviewed - No data to display  EKG   Radiology No results found.  Procedures Procedures (including critical care time)  Medications Ordered in UC Medications - No data to display  Initial Impression / Assessment and Plan / UC Course  I have reviewed the triage vital signs and the nursing notes.  Pertinent labs & imaging results that were available during my care of the patient were reviewed by me and considered in my medical decision making (see chart for details).     MDM: 1.  Acute URI-Rx  doxycycline  100 mg capsule: Take 1 capsule twice daily x 7 days; 2.  Cough,  unspecified type-Rx'd prednisone  20 mg tablet: Take 3 tablets p.o. daily x 5 days, Rx'd Hycodan 5-1.5 mg / 5 mL syrup: Take 5 mL every 6 hours, as needed for cough. Advised patient take medication as directed with food to completion.  Advised patient to take prednisone  with first dose of doxycycline  for the next 5 of 7 days.  Advised may use Hycodan cough syrup at night prior to sleep for cough due to sedative effects.  Encouraged increase daily water intake to 64 ounces per day while taking these medications.  Advised if symptoms worsen and/or unresolved please follow-up with your PCP or here for further evaluation.  Discharged home, hemodynamically stable Final Clinical Impressions(s) / UC Diagnoses   Final diagnoses:  Acute URI  Cough, unspecified type     Discharge Instructions      Advised patient take medication as directed with food to completion.  Advised patient to take prednisone  with first dose of doxycycline  for the next 5 of 7 days.  Advised may use Hycodan cough syrup at night prior to sleep for cough due to sedative effects.  Encouraged increase daily water intake to 64 ounces per day while taking these medications.  Advised if symptoms worsen and/or unresolved please follow-up with your PCP or here for further evaluation.     ED Prescriptions     Medication Sig Dispense Auth. Provider   doxycycline  (VIBRAMYCIN ) 100 MG capsule Take 1 capsule (100 mg total) by mouth 2 (two) times daily for 7 days. 14 capsule [place], FNP   predniSONE  (DELTASONE ) 20 MG tablet Take 3 tabs PO daily x 5 days. 15 tablet Marzetta Lanza, FNP   HYDROcodone  bit-homatropine (HYCODAN) 5-1.5 MG/5ML syrup Take 5 mLs by mouth every 6 (six) hours as needed for cough. 120 mL Teddy Sharper, FNP      I have reviewed the PDMP during this encounter.   Teddy Sharper, FNP 02/20/24 [phone]
